# Patient Record
Sex: Male | Born: 1965 | Race: White | Hispanic: No | Smoking: Former smoker
Health system: Southern US, Community
[De-identification: ages and names within clinical notes are randomized; demographics above are authoritative.]

## PROBLEM LIST (undated history)

## (undated) DIAGNOSIS — U071 COVID-19: Secondary | ICD-10-CM

## (undated) HISTORY — PX: HEMORRHOID SURGERY: SHX153

## (undated) HISTORY — PX: RHINOPLASTY: SUR1284

## (undated) HISTORY — PX: TONSILLECTOMY: SUR1361

---

## 2019-03-21 ENCOUNTER — Other Ambulatory Visit: Payer: Self-pay

## 2019-03-21 ENCOUNTER — Emergency Department (HOSPITAL_COMMUNITY): Payer: BLUE CROSS/BLUE SHIELD

## 2019-03-21 ENCOUNTER — Encounter (HOSPITAL_COMMUNITY): Payer: Self-pay | Admitting: Radiology

## 2019-03-21 ENCOUNTER — Inpatient Hospital Stay (HOSPITAL_COMMUNITY)
Admission: EM | Admit: 2019-03-21 | Discharge: 2019-03-23 | DRG: 141 | Disposition: A | Discharge status: Home / Self Care | Payer: BLUE CROSS/BLUE SHIELD | Attending: Surgery | Admitting: Surgery

## 2019-03-21 DIAGNOSIS — E669 Obesity, unspecified: Secondary | ICD-10-CM | POA: Diagnosis present

## 2019-03-21 DIAGNOSIS — G9340 Encephalopathy, unspecified: Secondary | ICD-10-CM | POA: Diagnosis present

## 2019-03-21 DIAGNOSIS — F10129 Alcohol abuse with intoxication, unspecified: Secondary | ICD-10-CM | POA: Diagnosis present

## 2019-03-21 DIAGNOSIS — S02652A Fracture of angle of left mandible, initial encounter for closed fracture: Secondary | ICD-10-CM | POA: Diagnosis present

## 2019-03-21 DIAGNOSIS — Z79899 Other long term (current) drug therapy: Secondary | ICD-10-CM | POA: Diagnosis not present

## 2019-03-21 DIAGNOSIS — Z6833 Body mass index (BMI) 33.0-33.9, adult: Secondary | ICD-10-CM

## 2019-03-21 DIAGNOSIS — R40243 Glasgow coma scale score 3-8, unspecified time: Secondary | ICD-10-CM | POA: Diagnosis present

## 2019-03-21 DIAGNOSIS — Z9289 Personal history of other medical treatment: Secondary | ICD-10-CM

## 2019-03-21 DIAGNOSIS — Z20828 Contact with and (suspected) exposure to other viral communicable diseases: Secondary | ICD-10-CM | POA: Diagnosis present

## 2019-03-21 DIAGNOSIS — R509 Fever, unspecified: Secondary | ICD-10-CM

## 2019-03-21 DIAGNOSIS — R404 Transient alteration of awareness: Secondary | ICD-10-CM | POA: Diagnosis present

## 2019-03-21 HISTORY — DX: COVID-19: U07.1

## 2019-03-21 LAB — URINALYSIS, ROUTINE W REFLEX MICROSCOPIC
Bilirubin Urine: NEGATIVE
Glucose, UA: NEGATIVE mg/dL
Hgb urine dipstick: NEGATIVE
Ketones, ur: NEGATIVE mg/dL
Leukocytes,Ua: NEGATIVE
Nitrite: NEGATIVE
Protein, ur: NEGATIVE mg/dL
Specific Gravity, Urine: 1.008 (ref 1.005–1.030)
pH: 5 (ref 5.0–8.0)

## 2019-03-21 LAB — COMPREHENSIVE METABOLIC PANEL
ALT: 35 U/L (ref 0–44)
ALT: 37 U/L (ref 0–44)
AST: 48 U/L — ABNORMAL HIGH (ref 15–41)
AST: 83 U/L — ABNORMAL HIGH (ref 15–41)
Albumin: 4.4 g/dL (ref 3.5–5.0)
Albumin: 3.6 g/dL (ref 3.5–5.0)
Alkaline Phosphatase: 72 U/L (ref 38–126)
Alkaline Phosphatase: 58 U/L (ref 38–126)
Anion gap: 19 — ABNORMAL HIGH (ref 5–15)
Anion gap: 17 — ABNORMAL HIGH (ref 5–15)
BUN: 13 mg/dL (ref 6–20)
BUN: 9 mg/dL (ref 6–20)
CO2: 17 mmol/L — ABNORMAL LOW (ref 22–32)
CO2: 17 mmol/L — ABNORMAL LOW (ref 22–32)
Calcium: 8.8 mg/dL — ABNORMAL LOW (ref 8.9–10.3)
Calcium: 8.1 mg/dL — ABNORMAL LOW (ref 8.9–10.3)
Chloride: 102 mmol/L (ref 98–111)
Chloride: 106 mmol/L (ref 98–111)
Creatinine, Ser: 1.02 mg/dL (ref 0.61–1.24)
Creatinine, Ser: 0.83 mg/dL (ref 0.61–1.24)
GFR calc Af Amer: >60 mL/min (ref >60)
GFR calc Af Amer: >60 mL/min (ref >60)
GFR calc non Af Amer: >60 mL/min (ref >60)
GFR calc non Af Amer: >60 mL/min (ref >60)
Glucose, Bld: 112 mg/dL — ABNORMAL HIGH (ref 70–99)
Glucose, Bld: 95 mg/dL (ref 70–99)
Potassium: 4.3 mmol/L (ref 3.5–5.1)
Potassium: 4.6 mmol/L (ref 3.5–5.1)
Sodium: 138 mmol/L (ref 135–145)
Sodium: 140 mmol/L (ref 135–145)
Total Bilirubin: 0.4 mg/dL (ref 0.3–1.2)
Total Bilirubin: 0.4 mg/dL (ref 0.3–1.2)
Total Protein: 7 g/dL (ref 6.5–8.1)
Total Protein: 6 g/dL — ABNORMAL LOW (ref 6.5–8.1)

## 2019-03-21 LAB — CBC
HCT: 49.7 % (ref 39.0–52.0)
HCT: 44.3 % (ref 39.0–52.0)
Hemoglobin: 16.7 g/dL (ref 13.0–17.0)
Hemoglobin: 14.7 g/dL (ref 13.0–17.0)
MCH: 31.8 pg (ref 26.0–34.0)
MCH: 32 pg (ref 26.0–34.0)
MCHC: 33.6 g/dL (ref 30.0–36.0)
MCHC: 33.2 g/dL (ref 30.0–36.0)
MCV: 94.7 fL (ref 80.0–100.0)
MCV: 96.5 fL (ref 80.0–100.0)
Platelets: 314 10*3/uL (ref 150–400)
Platelets: 248 10*3/uL (ref 150–400)
RBC: 5.25 MIL/uL (ref 4.22–5.81)
RBC: 4.59 MIL/uL (ref 4.22–5.81)
RDW: 11.8 % (ref 11.5–15.5)
RDW: 11.9 % (ref 11.5–15.5)
WBC: 15.7 10*3/uL — ABNORMAL HIGH (ref 4.0–10.5)
WBC: 13.1 10*3/uL — ABNORMAL HIGH (ref 4.0–10.5)
nRBC: 0 % (ref 0.0–0.2)
nRBC: 0 % (ref 0.0–0.2)

## 2019-03-21 LAB — POCT I-STAT 7, (LYTES, BLD GAS, ICA,H+H)
Acid-base deficit: 9 mmol/L — ABNORMAL HIGH (ref 0.0–2.0)
Bicarbonate: 18.3 mmol/L — ABNORMAL LOW (ref 20.0–28.0)
Calcium, Ion: 1.11 mmol/L — ABNORMAL LOW (ref 1.15–1.40)
HCT: 41 % (ref 39.0–52.0)
Hemoglobin: 13.9 g/dL (ref 13.0–17.0)
O2 Saturation: 100 %
Potassium: 3.8 mmol/L (ref 3.5–5.1)
Sodium: 137 mmol/L (ref 135–145)
TCO2: 20 mmol/L — ABNORMAL LOW (ref 22–32)
pCO2 arterial: 42.7 mmHg (ref 32.0–48.0)
pH, Arterial: 7.24 — ABNORMAL LOW (ref 7.350–7.450)
pO2, Arterial: 261 mmHg — ABNORMAL HIGH (ref 83.0–108.0)

## 2019-03-21 LAB — I-STAT CHEM 8, ED
BUN: 16 mg/dL (ref 6–20)
Calcium, Ion: 1.12 mmol/L — ABNORMAL LOW (ref 1.15–1.40)
Chloride: 102 mmol/L (ref 98–111)
Creatinine, Ser: 1.3 mg/dL — ABNORMAL HIGH (ref 0.61–1.24)
Glucose, Bld: 113 mg/dL — ABNORMAL HIGH (ref 70–99)
HCT: 50 % (ref 39.0–52.0)
Hemoglobin: 17 g/dL (ref 13.0–17.0)
Potassium: 4 mmol/L (ref 3.5–5.1)
Sodium: 139 mmol/L (ref 135–145)
TCO2: 23 mmol/L (ref 22–32)

## 2019-03-21 LAB — SAMPLE TO BLOOD BANK

## 2019-03-21 LAB — ETHANOL: Alcohol, Ethyl (B): 298 mg/dL — ABNORMAL HIGH (ref <10)

## 2019-03-21 LAB — SARS CORONAVIRUS 2 (TAT 6-24 HRS): SARS Coronavirus 2: NEGATIVE

## 2019-03-21 LAB — PROTIME-INR
INR: 1 (ref 0.8–1.2)
Prothrombin Time: 12.6 seconds (ref 11.4–15.2)

## 2019-03-21 LAB — MAGNESIUM: Magnesium: 1.6 mg/dL — ABNORMAL LOW (ref 1.7–2.4)

## 2019-03-21 LAB — PHOSPHORUS: Phosphorus: 3.9 mg/dL (ref 2.5–4.6)

## 2019-03-21 LAB — CDS SEROLOGY

## 2019-03-21 LAB — TRIGLYCERIDES: Triglycerides: 115 mg/dL (ref <150)

## 2019-03-21 LAB — LACTIC ACID, PLASMA: Lactic Acid, Venous: 4.5 mmol/L (ref 0.5–1.9)

## 2019-03-21 MED ORDER — PROPOFOL 1000 MG/100ML IV EMUL
INTRAVENOUS | Status: AC | PRN
  Administered 2019-03-21: 1756 ug via INTRAVENOUS

## 2019-03-21 MED ORDER — VITAMIN B-1 100 MG PO TABS
100.0000 mg | ORAL_TABLET | Freq: Every day | ORAL | Status: DC
  Administered 2019-03-21 – 2019-03-23 (×2): 100 mg via ORAL
  Filled 2019-03-21 (×2): qty 1

## 2019-03-21 MED ORDER — DOCUSATE SODIUM 100 MG PO CAPS
100.0000 mg | ORAL_CAPSULE | Freq: Two times a day (BID) | ORAL | Status: DC
  Administered 2019-03-21 – 2019-03-23 (×4): 100 mg via ORAL
  Filled 2019-03-21 (×4): qty 1

## 2019-03-21 MED ORDER — MAGNESIUM SULFATE 2 GM/50ML IV SOLN
2.0000 g | Freq: Once | INTRAVENOUS | Status: AC
  Administered 2019-03-21: 2 g via INTRAVENOUS
  Filled 2019-03-21: qty 50

## 2019-03-21 MED ORDER — PROPOFOL 1000 MG/100ML IV EMUL
5.0000 ug/kg/min | INTRAVENOUS | Status: DC
  Administered 2019-03-21 (×2): 20 ug/kg/min via INTRAVENOUS

## 2019-03-21 MED ORDER — ORAL CARE MOUTH RINSE
15.0000 mL | OROMUCOSAL | Status: DC
  Administered 2019-03-21: 15 mL via OROMUCOSAL

## 2019-03-21 MED ORDER — PROPOFOL 1000 MG/100ML IV EMUL
INTRAVENOUS | Status: AC
  Administered 2019-03-21: 20 ug/kg/min via INTRAVENOUS
  Filled 2019-03-21: qty 100

## 2019-03-21 MED ORDER — CHLORHEXIDINE GLUCONATE 0.12% ORAL RINSE (MEDLINE KIT)
15.0000 mL | Freq: Two times a day (BID) | OROMUCOSAL | Status: DC
  Administered 2019-03-21 – 2019-03-23 (×4): 15 mL via OROMUCOSAL

## 2019-03-21 MED ORDER — OXYCODONE HCL 5 MG PO TABS: 5.0000 mg | ORAL_TABLET | Freq: Four times a day (QID) | ORAL | Status: DC | PRN

## 2019-03-21 MED ORDER — THIAMINE HCL 100 MG/ML IJ SOLN: 100.0000 mg | Freq: Every day | INTRAMUSCULAR | Status: DC

## 2019-03-21 MED ORDER — LORAZEPAM 2 MG/ML IJ SOLN: 1.0000 mg | INTRAMUSCULAR | Status: DC | PRN

## 2019-03-21 MED ORDER — FENTANYL CITRATE (PF) 100 MCG/2ML IJ SOLN: 50.0000 ug | INTRAMUSCULAR | Status: DC | PRN

## 2019-03-21 MED ORDER — ENOXAPARIN SODIUM 40 MG/0.4ML ~~LOC~~ SOLN
40.0000 mg | SUBCUTANEOUS | Status: DC
  Administered 2019-03-21: 40 mg via SUBCUTANEOUS
  Filled 2019-03-21 (×2): qty 0.4

## 2019-03-21 MED ORDER — FENTANYL CITRATE (PF) 100 MCG/2ML IJ SOLN
50.0000 ug | INTRAMUSCULAR | Status: DC | PRN
  Filled 2019-03-21: qty 2

## 2019-03-21 MED ORDER — ETOMIDATE 2 MG/ML IV SOLN
INTRAVENOUS | Status: AC | PRN
  Administered 2019-03-21: 20 mg via INTRAVENOUS

## 2019-03-21 MED ORDER — ACETAMINOPHEN 500 MG PO TABS
1000.0000 mg | ORAL_TABLET | Freq: Four times a day (QID) | ORAL | Status: DC
  Administered 2019-03-21 – 2019-03-23 (×6): 1000 mg via ORAL
  Filled 2019-03-21 (×6): qty 2

## 2019-03-21 MED ORDER — IBUPROFEN 200 MG PO TABS: 200.0000 mg | ORAL_TABLET | Freq: Four times a day (QID) | ORAL | Status: DC | PRN

## 2019-03-21 MED ORDER — MIDAZOLAM HCL 2 MG/2ML IJ SOLN
INTRAMUSCULAR | Status: AC
  Filled 2019-03-21: qty 2

## 2019-03-21 MED ORDER — ACETAMINOPHEN 325 MG PO TABS
650.0000 mg | ORAL_TABLET | Freq: Four times a day (QID) | ORAL | Status: DC
  Administered 2019-03-21: 650 mg via ORAL
  Filled 2019-03-21: qty 2

## 2019-03-21 MED ORDER — CHLORHEXIDINE GLUCONATE CLOTH 2 % EX PADS
6.0000 | MEDICATED_PAD | Freq: Every day | CUTANEOUS | Status: DC
  Administered 2019-03-21 – 2019-03-23 (×3): 6 via TOPICAL

## 2019-03-21 MED ORDER — IOHEXOL 300 MG/ML  SOLN
100.0000 mL | Freq: Once | INTRAMUSCULAR | Status: AC | PRN
  Administered 2019-03-21: 100 mL via INTRAVENOUS

## 2019-03-21 MED ORDER — ADULT MULTIVITAMIN W/MINERALS CH
1.0000 | ORAL_TABLET | Freq: Every day | ORAL | Status: DC
  Administered 2019-03-21 – 2019-03-23 (×2): 1 via ORAL
  Filled 2019-03-21 (×2): qty 1

## 2019-03-21 MED ORDER — FOLIC ACID 1 MG PO TABS
1.0000 mg | ORAL_TABLET | Freq: Every day | ORAL | Status: DC
  Administered 2019-03-21 – 2019-03-23 (×2): 1 mg via ORAL
  Filled 2019-03-21 (×2): qty 1

## 2019-03-21 MED ORDER — HYDROMORPHONE HCL 1 MG/ML IJ SOLN: 0.5000 mg | INTRAMUSCULAR | Status: DC | PRN

## 2019-03-21 MED ORDER — ROCURONIUM BROMIDE 50 MG/5ML IV SOLN
INTRAVENOUS | Status: AC | PRN
  Administered 2019-03-21: 100 mg via INTRAVENOUS

## 2019-03-21 MED ORDER — LACTATED RINGERS IV BOLUS
1000.0000 mL | Freq: Once | INTRAVENOUS | Status: AC
  Administered 2019-03-21: 1000 mL via INTRAVENOUS

## 2019-03-21 MED ORDER — ORAL CARE MOUTH RINSE
15.0000 mL | Freq: Two times a day (BID) | OROMUCOSAL | Status: DC
  Administered 2019-03-21 – 2019-03-23 (×3): 15 mL via OROMUCOSAL

## 2019-03-21 MED ORDER — ONDANSETRON 4 MG PO TBDP: 4.0000 mg | ORAL_TABLET | Freq: Four times a day (QID) | ORAL | Status: DC | PRN

## 2019-03-21 MED ORDER — SUCCINYLCHOLINE CHLORIDE 20 MG/ML IJ SOLN
INTRAMUSCULAR | Status: AC | PRN
  Administered 2019-03-21: 150 mg via INTRAVENOUS

## 2019-03-21 MED ORDER — PROPOFOL 1000 MG/100ML IV EMUL
0.0000 ug/kg/min | INTRAVENOUS | Status: DC
  Administered 2019-03-21: 50 ug/kg/min via INTRAVENOUS
  Filled 2019-03-21: qty 100

## 2019-03-21 MED ORDER — SODIUM CHLORIDE 0.9 % IV SOLN
INTRAVENOUS | Status: DC
  Administered 2019-03-21 – 2019-03-22 (×2): via INTRAVENOUS

## 2019-03-21 MED ORDER — LORAZEPAM 1 MG PO TABS
1.0000 mg | ORAL_TABLET | ORAL | Status: DC | PRN
  Administered 2019-03-21 – 2019-03-22 (×2): 1 mg via ORAL
  Filled 2019-03-21 (×2): qty 1

## 2019-03-21 MED ORDER — ONDANSETRON HCL 4 MG/2ML IJ SOLN: 4.0000 mg | Freq: Four times a day (QID) | INTRAMUSCULAR | Status: DC | PRN
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
## 2019-03-21 NOTE — Anesthesia Preprocedure Evaluation (Addendum)
Anesthesia Evaluation  Patient identified by MRN, date of birth, ID band Patient awake    Reviewed: Allergy & Precautions, NPO status , Patient's Chart, lab work & pertinent test results  Airway Mallampati: II  TM Distance: >3 FB Neck ROM: Full    Dental  (+) Loose   Pulmonary neg pulmonary ROS,    Pulmonary exam normal breath sounds clear to auscultation       Cardiovascular negative cardio ROS Normal cardiovascular exam Rhythm:Regular Rate:Normal  ECG: rate 73. Sinus rhythm IVCD, consider atypical RBBB   Neuro/Psych PSYCHIATRIC DISORDERS negative neurological ROS     GI/Hepatic negative GI ROS, Neg liver ROS,   Endo/Other  negative endocrine ROS  Renal/GU negative Renal ROS     Musculoskeletal negative musculoskeletal ROS (+)   Abdominal (+) + obese,   Peds  Hematology negative hematology ROS (+)   Anesthesia Other Findings BILATERAL MANDIBLE FRACTURE  Reproductive/Obstetrics                            Anesthesia Physical Anesthesia Plan  ASA: II  Anesthesia Plan: General   Post-op Pain Management:    Induction: Intravenous  PONV Risk Score and Plan: 3 and Midazolam, Dexamethasone, Ondansetron and Treatment may vary due to age or medical condition  Airway Management Planned: Nasal ETT  Additional Equipment:   Intra-op Plan:   Post-operative Plan: Extubation in OR  Informed Consent: I have reviewed the patients History and Physical, chart, labs and discussed the procedure including the risks, benefits and alternatives for the proposed anesthesia with the patient or authorized representative who has indicated his/her understanding and acceptance.     Dental advisory given  Plan Discussed with: CRNA and Surgeon  Anesthesia Plan Comments:        Anesthesia Quick Evaluation
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
## 2019-03-21 NOTE — Progress Notes (Signed)
ABG results given to Dr. Sabra Heck. Verbal order received to place pt on 7cc/kg VT and to increase RR to 16. Titrate FiO2 as tolerated. RT will continue to monitor.
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
## 2019-03-21 NOTE — Procedures (Signed)
Extubation Procedure Note  Patient Details:   Name: Jay Johnson. DOB: 1965/12/11 MRN: [id number]   Airway Documentation:    Vent end date: 03/21/19 Vent end time: 1408   Evaluation  O2 sats: stable throughout Complications: No apparent complications Patient did tolerate procedure well. Bilateral Breath Sounds: Clear   Yes   Patient extubated per MD order. Positive cuff leak. Able to vocalize and clear secretions. SAT 99% on 4LNC  Saunders Glance 03/21/2019, 2:09 PM
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
## 2019-03-22 ENCOUNTER — Encounter (HOSPITAL_COMMUNITY): Admission: EM | Disposition: A | Discharge status: Home / Self Care | Payer: Self-pay | Source: Home / Self Care

## 2019-03-22 ENCOUNTER — Inpatient Hospital Stay (HOSPITAL_COMMUNITY): Payer: BLUE CROSS/BLUE SHIELD | Admitting: Certified Registered Nurse Anesthetist

## 2019-03-22 ENCOUNTER — Encounter (HOSPITAL_COMMUNITY): Payer: Self-pay

## 2019-03-22 ENCOUNTER — Inpatient Hospital Stay (HOSPITAL_COMMUNITY): Payer: BLUE CROSS/BLUE SHIELD

## 2019-03-22 HISTORY — PX: ORIF MANDIBULAR FRACTURE: SHX2127

## 2019-03-22 LAB — CBC
HCT: 39.6 % (ref 39.0–52.0)
Hemoglobin: 13.5 g/dL (ref 13.0–17.0)
MCH: 31.4 pg (ref 26.0–34.0)
MCHC: 34.1 g/dL (ref 30.0–36.0)
MCV: 92.1 fL (ref 80.0–100.0)
Platelets: 203 10*3/uL (ref 150–400)
RBC: 4.3 MIL/uL (ref 4.22–5.81)
RDW: 12 % (ref 11.5–15.5)
WBC: 10.3 10*3/uL (ref 4.0–10.5)
nRBC: 0 % (ref 0.0–0.2)

## 2019-03-22 LAB — SURGICAL PCR SCREEN
MRSA, PCR: POSITIVE — AB
Staphylococcus aureus: POSITIVE — AB

## 2019-03-22 LAB — BASIC METABOLIC PANEL
Anion gap: 9 (ref 5–15)
BUN: 8 mg/dL (ref 6–20)
CO2: 22 mmol/L (ref 22–32)
Calcium: 8 mg/dL — ABNORMAL LOW (ref 8.9–10.3)
Chloride: 107 mmol/L (ref 98–111)
Creatinine, Ser: 0.93 mg/dL (ref 0.61–1.24)
GFR calc Af Amer: >60 mL/min (ref >60)
GFR calc non Af Amer: >60 mL/min (ref >60)
Glucose, Bld: 135 mg/dL — ABNORMAL HIGH (ref 70–99)
Potassium: 4.3 mmol/L (ref 3.5–5.1)
Sodium: 138 mmol/L (ref 135–145)

## 2019-03-22 LAB — HIV ANTIBODY (ROUTINE TESTING W REFLEX): HIV Screen 4th Generation wRfx: NONREACTIVE

## 2019-03-22 SURGERY — OPEN REDUCTION INTERNAL FIXATION (ORIF) MANDIBULAR FRACTURE
Anesthesia: General | Site: Mouth | Laterality: Bilateral

## 2019-03-22 MED ORDER — OXYMETAZOLINE HCL 0.05 % NA SOLN
NASAL | Status: AC
  Filled 2019-03-22: qty 30

## 2019-03-22 MED ORDER — ONDANSETRON HCL 4 MG/2ML IJ SOLN
INTRAMUSCULAR | Status: DC | PRN
  Administered 2019-03-22: 4 mg via INTRAVENOUS

## 2019-03-22 MED ORDER — ARTIFICIAL TEARS OPHTHALMIC OINT
TOPICAL_OINTMENT | OPHTHALMIC | Status: AC
  Filled 2019-03-22: qty 3.5

## 2019-03-22 MED ORDER — FENTANYL CITRATE (PF) 250 MCG/5ML IJ SOLN
INTRAMUSCULAR | Status: DC | PRN
  Administered 2019-03-22: 25 ug via INTRAVENOUS
  Administered 2019-03-22: 50 ug via INTRAVENOUS
  Administered 2019-03-22: 100 ug via INTRAVENOUS
  Administered 2019-03-22 (×2): 25 ug via INTRAVENOUS
  Administered 2019-03-22 (×2): 50 ug via INTRAVENOUS

## 2019-03-22 MED ORDER — LIDOCAINE-EPINEPHRINE 2 %-1:100000 IJ SOLN
INTRAMUSCULAR | Status: AC
  Filled 2019-03-22: qty 1

## 2019-03-22 MED ORDER — ARTIFICIAL TEARS OPHTHALMIC OINT
TOPICAL_OINTMENT | OPHTHALMIC | Status: DC | PRN
  Administered 2019-03-22: 1 via OPHTHALMIC

## 2019-03-22 MED ORDER — CEFAZOLIN SODIUM-DEXTROSE 2-3 GM-%(50ML) IV SOLR
INTRAVENOUS | Status: DC | PRN
  Administered 2019-03-22: 2 g via INTRAVENOUS

## 2019-03-22 MED ORDER — CHLORHEXIDINE GLUCONATE 0.12 % MT SOLN: 15.0000 mL | Freq: Two times a day (BID) | OROMUCOSAL | 0 refills | Status: DC

## 2019-03-22 MED ORDER — PROPOFOL 10 MG/ML IV BOLUS
INTRAVENOUS | Status: AC
  Filled 2019-03-22: qty 40

## 2019-03-22 MED ORDER — FENTANYL CITRATE (PF) 250 MCG/5ML IJ SOLN
INTRAMUSCULAR | Status: AC
  Filled 2019-03-22: qty 5

## 2019-03-22 MED ORDER — 0.9 % SODIUM CHLORIDE (POUR BTL) OPTIME
TOPICAL | Status: DC | PRN
  Administered 2019-03-22: 1000 mL

## 2019-03-22 MED ORDER — PROPOFOL 10 MG/ML IV BOLUS
INTRAVENOUS | Status: DC | PRN
  Administered 2019-03-22 (×4): 50 mg via INTRAVENOUS

## 2019-03-22 MED ORDER — LIDOCAINE-EPINEPHRINE 1 %-1:100000 IJ SOLN
INTRAMUSCULAR | Status: AC
  Filled 2019-03-22: qty 1

## 2019-03-22 MED ORDER — SUCCINYLCHOLINE CHLORIDE 200 MG/10ML IV SOSY
PREFILLED_SYRINGE | INTRAVENOUS | Status: AC
  Filled 2019-03-22: qty 10

## 2019-03-22 MED ORDER — ROCURONIUM BROMIDE 10 MG/ML (PF) SYRINGE
PREFILLED_SYRINGE | INTRAVENOUS | Status: AC
  Filled 2019-03-22: qty 10

## 2019-03-22 MED ORDER — OXYMETAZOLINE HCL 0.05 % NA SOLN
NASAL | Status: DC | PRN
  Administered 2019-03-22: 3 via NASAL

## 2019-03-22 MED ORDER — CEFAZOLIN SODIUM 1 G IJ SOLR
INTRAMUSCULAR | Status: AC
  Filled 2019-03-22: qty 20

## 2019-03-22 MED ORDER — ACETAMINOPHEN 10 MG/ML IV SOLN: 1000.0000 mg | Freq: Once | INTRAVENOUS | Status: DC | PRN

## 2019-03-22 MED ORDER — LIDOCAINE 2% (20 MG/ML) 5 ML SYRINGE
INTRAMUSCULAR | Status: AC
  Filled 2019-03-22: qty 5

## 2019-03-22 MED ORDER — LIDOCAINE-EPINEPHRINE 1 %-1:100000 IJ SOLN
INTRAMUSCULAR | Status: DC | PRN
  Administered 2019-03-22 (×2): 10 mL

## 2019-03-22 MED ORDER — BSS IO SOLN
INTRAOCULAR | Status: DC | PRN
  Administered 2019-03-22: 1 via INTRAOCULAR

## 2019-03-22 MED ORDER — DEXAMETHASONE SODIUM PHOSPHATE 10 MG/ML IJ SOLN
INTRAMUSCULAR | Status: AC
  Filled 2019-03-22: qty 1

## 2019-03-22 MED ORDER — BSS IO SOLN
INTRAOCULAR | Status: AC
  Filled 2019-03-22: qty 15

## 2019-03-22 MED ORDER — LIDOCAINE 2% (20 MG/ML) 5 ML SYRINGE
INTRAMUSCULAR | Status: DC | PRN
  Administered 2019-03-22: 60 mg via INTRAVENOUS
  Administered 2019-03-22: 40 mg via INTRAVENOUS

## 2019-03-22 MED ORDER — ROCURONIUM BROMIDE 100 MG/10ML IV SOLN
INTRAVENOUS | Status: DC | PRN
  Administered 2019-03-22: 60 mg via INTRAVENOUS
  Administered 2019-03-22 (×3): 10 mg via INTRAVENOUS

## 2019-03-22 MED ORDER — MIDAZOLAM HCL 2 MG/2ML IJ SOLN
INTRAMUSCULAR | Status: AC
  Filled 2019-03-22: qty 2

## 2019-03-22 MED ORDER — MUPIROCIN 2 % EX OINT
1.0000 "application " | TOPICAL_OINTMENT | Freq: Two times a day (BID) | CUTANEOUS | Status: DC
  Administered 2019-03-22 – 2019-03-23 (×3): 1 via NASAL
  Filled 2019-03-22 (×2): qty 22

## 2019-03-22 MED ORDER — SODIUM CHLORIDE 0.9 % IV SOLN
INTRAVENOUS | Status: DC
  Administered 2019-03-22: 16:00:00 via INTRAVENOUS

## 2019-03-22 MED ORDER — SUGAMMADEX SODIUM 200 MG/2ML IV SOLN
INTRAVENOUS | Status: DC | PRN
  Administered 2019-03-22: 200 mg via INTRAVENOUS

## 2019-03-22 MED ORDER — ONDANSETRON HCL 4 MG/2ML IJ SOLN
INTRAMUSCULAR | Status: AC
  Filled 2019-03-22: qty 2

## 2019-03-22 MED ORDER — KETOROLAC TROMETHAMINE 30 MG/ML IJ SOLN
INTRAMUSCULAR | Status: AC
  Administered 2019-03-22: 13:00:00
  Filled 2019-03-22: qty 1

## 2019-03-22 MED ORDER — KETOROLAC TROMETHAMINE 30 MG/ML IJ SOLN
30.0000 mg | Freq: Once | INTRAMUSCULAR | Status: AC
  Administered 2019-03-22: 30 mg via INTRAVENOUS

## 2019-03-22 MED ORDER — DEXAMETHASONE SODIUM PHOSPHATE 10 MG/ML IJ SOLN
INTRAMUSCULAR | Status: DC | PRN
  Administered 2019-03-22: 10 mg via INTRAVENOUS

## 2019-03-22 MED ORDER — AMOXICILLIN 500 MG PO CAPS: 500.0000 mg | ORAL_CAPSULE | Freq: Three times a day (TID) | ORAL | 0 refills | Status: DC

## 2019-03-22 MED ORDER — LACTATED RINGERS IV SOLN
INTRAVENOUS | Status: DC
  Administered 2019-03-22 (×2): via INTRAVENOUS

## 2019-03-22 MED ORDER — LIDOCAINE HCL 2 % IJ SOLN
INTRAMUSCULAR | Status: AC
  Filled 2019-03-22: qty 20

## 2019-03-22 MED ORDER — MIDAZOLAM HCL 5 MG/5ML IJ SOLN
INTRAMUSCULAR | Status: DC | PRN
  Administered 2019-03-22: 2 mg via INTRAVENOUS

## 2019-03-22 SURGICAL SUPPLY — 67 items
BIT DRILL 1.6X5 (BIT) ×1
BIT DRILL 1.6X5MM (BIT) IMPLANT
BIT DRILL RAINBOW 1.6X35 (BIT) ×4 IMPLANT
BLADE CLIPPER SURG (BLADE) IMPLANT
BLADE SURG 15 STRL LF DISP TIS (BLADE) ×1 IMPLANT
BLADE SURG 15 STRL SS (BLADE) ×2
CANISTER SUCT 3000ML PPV (MISCELLANEOUS) ×3 IMPLANT
CLEANER TIP ELECTROSURG 2X2 (MISCELLANEOUS) ×3 IMPLANT
COVER SURGICAL LIGHT HANDLE (MISCELLANEOUS) ×3 IMPLANT
COVER WAND RF STERILE (DRAPES) ×3 IMPLANT
DRILL BIT 1.6X5MM (BIT) ×2
ELECT COATED BLADE 2.86 ST (ELECTRODE) ×3 IMPLANT
ELECT NDL BLADE 2-5/6 (NEEDLE) IMPLANT
ELECT NEEDLE BLADE 2-5/6 (NEEDLE) ×3 IMPLANT
ELECT REM PT RETURN 9FT ADLT (ELECTROSURGICAL) ×3
ELECTRODE REM PT RTRN 9FT ADLT (ELECTROSURGICAL) ×1 IMPLANT
GLOVE BIO SURGEON STRL SZ 6.5 (GLOVE) ×2 IMPLANT
GLOVE BIO SURGEONS STRL SZ 6.5 (GLOVE) ×1
GLOVE BIOGEL PI IND STRL 7.0 (GLOVE) IMPLANT
GLOVE BIOGEL PI INDICATOR 7.0 (GLOVE) ×8
GLOVE ECLIPSE 7.5 STRL STRAW (GLOVE) ×2 IMPLANT
GLOVE SKINSENSE NS SZ7.5 (GLOVE) ×6
GLOVE SKINSENSE STRL SZ7.5 (GLOVE) IMPLANT
GOWN STRL REUS W/ TWL LRG LVL3 (GOWN DISPOSABLE) ×2 IMPLANT
GOWN STRL REUS W/TWL LRG LVL3 (GOWN DISPOSABLE) ×4
IV CATH 16GX1.16 SAFE RETR GRY (IV SOLUTION) ×4 IMPLANT
KIT BASIN OR (CUSTOM PROCEDURE TRAY) ×3 IMPLANT
KIT TURNOVER KIT B (KITS) ×3 IMPLANT
NDL HYPO 25GX1X1/2 BEV (NEEDLE) IMPLANT
NDL PRECISIONGLIDE 27X1.5 (NEEDLE) ×1 IMPLANT
NEEDLE HYPO 25GX1X1/2 BEV (NEEDLE) ×6 IMPLANT
NEEDLE PRECISIONGLIDE 27X1.5 (NEEDLE) ×3 IMPLANT
NS IRRIG 1000ML POUR BTL (IV SOLUTION) ×3 IMPLANT
PAD ARMBOARD 7.5X6 YLW CONV (MISCELLANEOUS) ×6 IMPLANT
PENCIL BUTTON HOLSTER BLD 10FT (ELECTRODE) ×3 IMPLANT
PLATE 4 H FRACTURE C SHAPE (Plate) ×2 IMPLANT
PLATE 4 H MINI W/BAR (Plate) ×2 IMPLANT
PLATE HYBRID MMF SM (Plate) ×4 IMPLANT
PLATE MNDBLE FRACTURE 4H (Plate) ×2 IMPLANT
POSITIONER HEAD DONUT 9IN (MISCELLANEOUS) ×3 IMPLANT
PROTECTOR CORNEAL (OPHTHALMIC RELATED) IMPLANT
SCISSORS WIRE ANG 4 3/4 DISP (INSTRUMENTS) ×3 IMPLANT
SCREW BONE CROSS PIN 2.0X10MM (Screw) ×8 IMPLANT
SCREW BONE CROSS PIN 2.0X12MM (Screw) ×4 IMPLANT
SCREW LOCK SELFDRIL 2.0X8M MMF (Screw) ×8 IMPLANT
SCREW LOCKING SELF DRILL 2.0X6 (Screw) ×10 IMPLANT
SCREW MNDBLE 2.0X4 BONE (Screw) ×8 IMPLANT
SCREW MNDBLE 2.3X10MM BONE (Screw) ×4 IMPLANT
SCREW MNDBLE 2.3X4MM BONE (Screw) ×2 IMPLANT
SCREW UPPER FACE 2.0X12MM (Screw) ×4 IMPLANT
SPLINT NASAL DOYLE BI-VL (GAUZE/BANDAGES/DRESSINGS) IMPLANT
STAPLER VISISTAT 35W (STAPLE) ×2 IMPLANT
SUT CHROMIC 3 0 SH 27 (SUTURE) ×4 IMPLANT
SUT MON AB 3-0 SH 27 (SUTURE) ×4
SUT MON AB 3-0 SH27 (SUTURE) ×2 IMPLANT
SUT PROLENE 5 0 PS 2 (SUTURE) ×2 IMPLANT
SUT PROLENE 6 0 PC 1 (SUTURE) IMPLANT
SUT STEEL 0 (SUTURE)
SUT STEEL 0 18XMFL TIE 17 (SUTURE) IMPLANT
SUT STEEL 1 (SUTURE) IMPLANT
SUT STEEL 2 (SUTURE) ×2 IMPLANT
SUT STEEL 4 (SUTURE) IMPLANT
SUT VICRYL 4-0 PS2 18IN ABS (SUTURE) IMPLANT
SYR 50ML LL SCALE MARK (SYRINGE) ×2 IMPLANT
TOWEL GREEN STERILE (TOWEL DISPOSABLE) ×3 IMPLANT
TOWEL GREEN STERILE FF (TOWEL DISPOSABLE) ×3 IMPLANT
TRAY ENT MC OR (CUSTOM PROCEDURE TRAY) ×3 IMPLANT

## 2019-03-22 NOTE — Progress Notes (Signed)
CT reviewed- no c-spine injury Patient denies neck pain Collar removed, no pain with full range of motion, no c-spine tenderness No upper extremity numbness or paresthesias  Collar clinically cleared

## 2019-03-22 NOTE — Transfer of Care (Signed)
Immediate Anesthesia Transfer of Care Note  Patient: Jay Johnson.  Procedure(s) Performed: OPEN REDUCTION INTERNAL FIXATION (ORIF) MANDIBULAR FRACTURE POSSIBLE TEETH EXTRACTION (Bilateral Mouth)  Patient Location: PACU  Anesthesia Type:General  Level of Consciousness: drowsy  Airway & Oxygen Therapy: Patient Spontanous Breathing and Patient connected to face mask oxygen  Post-op Assessment: Report given to RN and Post -op Vital signs reviewed and stable  Post vital signs: Reviewed  Last Vitals:  Vitals Value Taken Time  BP    Temp    Pulse    Resp    SpO2      Last Pain:  Vitals:   03/22/19 1220  TempSrc:   PainSc: (P) 0-No pain      Patients Stated Pain Goal: 4 (74/82/70 7867)  Complications: No apparent anesthesia complications
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
## 2019-03-22 NOTE — Anesthesia Procedure Notes (Signed)
Procedure Name: Intubation Date/Time: 03/22/2019 8:23 AM Performed by: Janene Harvey, CRNA Pre-anesthesia Checklist: Patient identified, Emergency Drugs available, Suction available and Patient being monitored Patient Re-evaluated:Patient Re-evaluated prior to induction Oxygen Delivery Method: Circle system utilized Preoxygenation: Pre-oxygenation with 100% oxygen Induction Type: IV induction Ventilation: Two handed mask ventilation required Laryngoscope Size: Glidescope and 3 Grade View: Grade I Nasal Tubes: Right Tube size: 7.0 mm Number of attempts: 1 Airway Equipment and Method: Stylet and Oral airway Placement Confirmation: ETT inserted through vocal cords under direct vision,  positive ETCO2 and breath sounds checked- equal and bilateral Secured at: 28 cm Tube secured with: Tape Dental Injury: Teeth and Oropharynx as per pre-operative assessment  Comments: Afrin in short stay to both nostrils. 40F nasal airway placed easily to right nare, two hand mask ventilation. 7.0 nasal tube placed easily in right nare, DL with glidescope, grade 1 view, tube easily passed through vocal cords.
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
## 2019-03-22 NOTE — Brief Op Note (Signed)
03/22/2019  12:32 PM  PATIENT:  Jay Johnson.  53 y.o. male  PRE-OPERATIVE DIAGNOSIS:  BILATERAL MANDIBLE FRACTURE  POST-OPERATIVE DIAGNOSIS:  BILATERAL MANDIBLE FRACTURE  PROCEDURE:  Procedure(s): OPEN REDUCTION INTERNAL FIXATION (ORIF) MANDIBULAR FRACTURE POSSIBLE TEETH EXTRACTION (Bilateral)  SURGEON:  Surgeon(s) and Role:    * Ronal Fear, MD - Primary  PHYSICIAN ASSISTANT: None  ANESTHESIA:   general  EBL:  125 mL   BLOOD ADMINISTERED:none  DRAINS: none   LOCAL MEDICATIONS USED:  LIDOCAINE  and Amount: 10 ml  SPECIMEN:  No Specimen  DISPOSITION OF SPECIMEN:  N/A  COUNTS:  YES  TOURNIQUET:  * No tourniquets in log *  DICTATION: .Note written in EPIC  PLAN OF CARE: Patient admitted to trauma  PATIENT DISPOSITION:  PACU - hemodynamically stable.   Delay start of Pharmacological VTE agent (>24hrs) due to surgical blood loss or risk of bleeding: no

## 2019-03-22 NOTE — Progress Notes (Signed)
Pt arrived to room 6N17 via bed from 4N. Received report from [place], Therapist, sports. See assessment. Will continue to monitor.

## 2019-03-22 NOTE — Anesthesia Postprocedure Evaluation (Signed)
Anesthesia Post Note  Patient: Jay Johnson.  Procedure(s) Performed: OPEN REDUCTION INTERNAL FIXATION (ORIF) MANDIBULAR FRACTURE POSSIBLE TEETH EXTRACTION (Bilateral Mouth)     Patient location during evaluation: PACU Anesthesia Type: General Level of consciousness: awake and alert Pain management: pain level controlled Vital Signs Assessment: post-procedure vital signs reviewed and stable Respiratory status: spontaneous breathing, nonlabored ventilation, respiratory function stable and patient connected to nasal cannula oxygen Cardiovascular status: blood pressure returned to baseline and stable Postop Assessment: no apparent nausea or vomiting Anesthetic complications: no    Last Vitals:  Vitals:   03/22/19 1300 03/22/19 1400  BP: (!) 119/100 126/65  Pulse: 100 84  Resp: 19 14  Temp: (!) 38.1 C 37.7 C  SpO2: 96% 97%    Last Pain:  Vitals:   03/22/19 1257  TempSrc: Bladder  PainSc:                  Karyl Kinnier Ellender

## 2019-03-22 NOTE — Plan of Care (Signed)
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
## 2019-03-23 MED ORDER — AMOXICILLIN 500 MG PO CAPS: 500.0000 mg | ORAL_CAPSULE | Freq: Three times a day (TID) | ORAL | 0 refills | Status: AC

## 2019-03-23 MED ORDER — OXYCODONE HCL 5 MG PO TABS: 5.0000 mg | ORAL_TABLET | Freq: Four times a day (QID) | ORAL | 0 refills | Status: DC | PRN

## 2019-03-23 MED ORDER — OXYCODONE HCL 5 MG PO TABS: 5.0000 mg | ORAL_TABLET | Freq: Four times a day (QID) | ORAL | 0 refills | Status: AC | PRN

## 2019-03-23 MED ORDER — ENSURE ENLIVE PO LIQD
237.0000 mL | Freq: Two times a day (BID) | ORAL | Status: DC
  Administered 2019-03-23 (×2): 237 mL via ORAL

## 2019-03-23 MED ORDER — OXYCODONE HCL 5 MG PO TABS: 5.0000 mg | ORAL_TABLET | Freq: Four times a day (QID) | ORAL | Status: DC | PRN

## 2019-03-23 MED ORDER — AMOXICILLIN 500 MG PO CAPS
500.0000 mg | ORAL_CAPSULE | Freq: Three times a day (TID) | ORAL | Status: DC
  Administered 2019-03-23 (×2): 500 mg via ORAL
  Filled 2019-03-23 (×2): qty 1
  Filled 2019-03-23: qty 2
  Filled 2019-03-23: qty 1

## 2019-03-23 MED ORDER — CHLORHEXIDINE GLUCONATE 0.12 % MT SOLN: 15.0000 mL | Freq: Two times a day (BID) | OROMUCOSAL | 0 refills | Status: AC
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
## 2019-03-23 NOTE — Plan of Care (Signed)
  Problem: Health Behavior/Discharge Planning: Goal: Ability to manage health-related needs will improve Outcome: Progressing   

## 2019-03-23 NOTE — Progress Notes (Signed)
Foley Catheter discontinued at 10:30 AM per order.  Pt voided 400 mLs at 1245.

## 2019-03-23 NOTE — Discharge Instructions (Signed)
Mandibular Fracture  A mandibular fracture is a break in the jawbone. Follow these instructions at home:  Please contact our office to schedule a follow up appointment with Dr. Julien Girt for 2 weeks from your surgery. ([facility]- phone: [phone])  You have been prescribed an antibiotic called Amoxicillin. Please take this until it is completed.   You have also been prescribed an antibiotic mouth rinse. Please use this twice daily for 2 weeks.   Sutures in your mouth will dissolve on their own, sutures on your cheek will be removed at your follow up appointment.   Maintain a non-chew diet for 6 weeks after surgery (mashed potatoes, smoothie, scrambled eggs, etc..)  If told, apply ice to the injured area: ? Put ice in a plastic bag. ? Place a towel between your skin and the bag. ? Leave the ice on for 20 minutes, 2-3 times per day.  Take over-the-counter and prescription medicines only as told by your doctor.  Follow your doctor's instructions about diet. You may need to eat only soft foods or liquid foods. Make sure to get enough protein and other nutrients in your diet.  Sleep on your back. This makes it so that you do not put pressure on your jaw.  Try not to exercise so hard that you get short of breath. Contact a doctor if:  You have a bad headache.  You cannot feel parts of your face.  You have very bad pain in your jaw that does not get better when you take medicine.  You feel like you are going to throw up (nauseous) and nothing helps.  You feel anxious and nothing helps.  Your swelling or redness gets worse. Get help right away if:  You have a fever.  You have trouble breathing.  You feel like your airway is tight.  You cannot swallow your spit (saliva).  You make a high-pitched whistling sound when you breathe (wheeze). This information is not intended to replace advice given to you by your health care provider. Make sure you discuss any  questions you have with your health care provider. Document Released: 07/16/2011 Document Revised: 05/26/2015 Document Reviewed: 01/16/2015 Elsevier Patient Education  2020 Elsevier Inc[employer]    RIB FRACTURES  HOME INSTRUCTIONS   1. PAIN CONTROL:  1. Pain is best controlled by a usual combination of three different methods TOGETHER:  i. Ice/Heat ii. Over the counter pain medication iii. Prescription pain medication 2. You may experience some swelling and bruising in area of injury. Ice packs or heating pads (30-60 minutes up to 6 times a day) will help. Use ice for the first few days to help decrease swelling and bruising, then switch to heat to help relax tight/sore spots and speed recovery. Some people prefer to use ice alone, heat alone, alternating between ice & heat. Experiment to what works for you. Swelling and bruising can take several weeks to resolve.  3. It is helpful to take an over-the-counter pain medication regularly for the first few weeks. Choose one of the following that works best for you:  i. Naproxen (Aleve, etc) Two 220mg  tabs twice a day ii. Ibuprofen (Advil, etc) Three 200mg  tabs four times a day (every meal & bedtime) iii. Acetaminophen (Tylenol, etc) 500-650mg  four times a day (every meal & bedtime) 4. A prescription for pain medication (such as oxycodone, hydrocodone, etc) may be given to you upon discharge. Take your pain medication as prescribed.  i. If you are having problems/concerns with the  prescription medicine (does not control pain, nausea, vomiting, rash, itching, etc), please call us [phone] to see if we need to switch you to a different pain medicine that will work better for you and/or control your side effect better. ii. If you need a refill on your pain medication, please contact your pharmacy. They will contact our office to request authorization. Prescriptions will not be filled after 5 pm or on week-ends. 1. Avoid getting constipated. When taking  pain medications, it is common to experience some constipation. Increasing fluid intake and taking a fiber supplement (such as Metamucil, Citrucel, FiberCon, MiraLax, etc) 1-2 times a day regularly will usually help prevent this problem from occurring. A mild laxative (prune juice, Milk of Magnesia, MiraLax, etc) should be taken according to package directions if there are no bowel movements after 48 hours.  2. Watch out for diarrhea. If you have many loose bowel movements, simplify your diet to bland foods & liquids for a few days. Stop any stool softeners and decrease your fiber supplement. Switching to mild anti-diarrheal medications (Kayopectate, Pepto Bismol) can help. If this worsens or does not improve, please call us. 3. FOLLOW UP  a. If a follow up appointment is needed one will be scheduled for you. If none is needed with our trauma team, please follow up with your primary care provider within 2-3 weeks from discharge. Please call [facility] at [phone] if you have any questions about follow up.  b. If you have any orthopedic or other injuries you will need to follow up as outlined in your follow up instructions.   WHEN TO CALL us [phone]:  1. Poor pain control 2. Reactions / problems with new medications (rash/itching, nausea, etc)  3. Fever over 101.5 F (38.5 C) 4. Worsening swelling or bruising 5. Worsening pain, productive cough, difficulty breathing or any other concerning symptoms  The clinic staff is available to answer your questions during regular business hours (8:30am-5pm). Please dont hesitate to call and ask to speak to one of our nurses for clinical concerns.  If you have a medical emergency, go to the nearest emergency room or call 911.  A surgeon from [facility] is always on call at the [facility], [place], [place], [place], [place] [address] ?  MAIN: [phone] ? TOLL FREE: [phone] ?  FAX ([phone])  [phone]  [email or web address]       Dysphagia Eating Plan, Pureed This diet is helpful for people with moderate to severe swallowing problems. Pureed foods are smooth and are prepared without lumps so that they can be swallowed safely. Work with your health care provider and your diet and nutrition specialist (dietitian) to make sure that you are following the diet safely and getting all the nutrients you need. What are tips for following this plan? General instructions  You may eat foods that are soft and have a pudding-like texture.  Do not eat foods that you have to chew. If you have to chew the food, then you cannot eat it.  Avoid foods that are hard, dry, sticky, chunky, lumpy, or stringy. Also avoid foods with nuts, seeds, raisins, skins, or pulp.  You may be instructed to thicken liquids. Follow your health care provider's instructions about how to do this and to what consistency. Cooking   If a food is not originally a smooth texture, you may be able to eat the food after: ? Pureeing it. This can  be done with a blender. ? Moistening it. This can be done by adding juice, cooking liquid, gravy, or sauce to a dry food and then pureeing it. For example, you may have bread if you soak it in milk and puree it.  If a food is too thin, you may add a commercial thickener, corn starch, rice cereal, or potato flakes to thicken it.  Strain and throw away any liquid that separates from a solid pureed food before eating.  Strain lumps, chunks, pulp, and seeds from pureed foods before eating.  Reheat foods slowly to prevent a tough crust from forming. Meal planning  Eat a variety of foods to get all the nutrients you need.  Add dry milk or protein powder to food to increase calories and protein content.  Follow your meal plan as told by your dietitian. What foods are allowed? The items listed may not be a complete list. Talk with your dietitian about what dietary choices  are best for you. Grains Soft breads, pancakes, [place] toast, muffins, and bread stuffing pureed to a smooth, moist texture, without nuts or seeds. Cooked cereals that have a pudding-like consistency, such as cream of wheat or farina. Pureed oatmeal. Pureed, well-cooked pasta and rice. Vegetables Pureed vegetables. Smooth tomato paste or sauce. Mashed or pureed potatoes without skin. Fruits Pureed fruits such as melons and apples without seeds or pulp. Mashed bananas. Mashed avocado. Fruit juices without pulp or seeds. Meats and other protein foods Pureed meat, poultry, and fish. Smooth pate or liverwurst. Smooth souffles. Pureed beans (such as lentils). Pureed eggs. Smooth nut and seed butters. Pureed tofu. Dairy Yogurt. Milk. Pureed cottage cheese. Nutritional dairy drinks or shakes. Cream cheese. Smooth pudding, ice cream, sherbet, and malts. Fats and oils Butter. Margarine. Vegetable oils. Smooth and strained gravy. Sour cream. Mayonnaise. Smooth sauces such as white sauce, cheese sauce, or hollandaise sauce. Sweets and desserts Moistened and pureed cookies and cakes. Whipped topping. Gelatin. Pudding pops. Seasoning and other foods Finely ground spices. Jelly. Honey. Pureed casseroles. Strained soups. Pureed sandwiches. Beverages Anything prepared at the consistency recommended by your dietitian. What foods are not allowed? The items listed may not be a complete list. Talk with your dietitian about what dietary choices are best for you. Grains Oatmeal. Dry cereals. Hard breads. Breads with seeds or nuts. Whole pasta, rice, or other grains. Whole pancakes, waffles, biscuits, muffins, or rolls. Vegetables Whole vegetables. Stringy vegetables (such as celery). Tomatoes or tomato sauce with seeds. Fried vegetables. Fruits Whole fresh, frozen, canned, or dried fruits that have not been pureed. Stringy fruits, such as pineapple or coconut. Watermelon with seeds. Dried fruit or fruit  leather. Meat and other protein foods Whole or ground meat, fish, or poultry. Dried or cooked lentils or legumes that have been cooked but not mashed or pureed. Non-pureed eggs. Nuts and seeds. Crunchy peanut butter. Whole tofu or other meat alternatives. Dairy Cheese cubes or slices. Non-pureed cottage cheese. Yogurt with fruit chunks. Fats and oils All fats and sauces that have lumps or chunks. Sweets and desserts Solid desserts. Sticky, chewy sweets (such as licorice and caramel). Candy with nuts or coconut. Seasoning and other foods Coarse or seeded herbs and spices. Chunky preserves. Jams with seeds. Whole sandwiches. Non-pureed casseroles. Chunky soups. Summary  Pureed foods can be helpful for people with moderate to severe swallowing problems.  On the dysphagia eating plan, you may eat foods that are soft and have a pudding-like texture. You should avoid foods that you  have to chew. If you have to chew the food, then you cannot eat it.  You may be instructed to thicken liquids. Follow your health care provider's instructions about how to do this and to what consistency. This information is not intended to replace advice given to you by your health care provider. Make sure you discuss any questions you have with your health care provider. Document Released: 04/23/2005 Document Revised: 08/14/2018 Document Reviewed: 06/26/2016 Elsevier Patient Education  2020 [employer].
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
## 2019-03-24 ENCOUNTER — Encounter (HOSPITAL_COMMUNITY): Payer: Self-pay | Admitting: Student
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
